# Patient Record
Sex: Male | Born: 2000 | Race: Black or African American | Hispanic: No | Marital: Single | Smoking: Never smoker
Health system: Southern US, Community
[De-identification: ages and names within clinical notes are randomized; demographics above are authoritative.]

## PROBLEM LIST (undated history)

## (undated) DIAGNOSIS — F419 Anxiety disorder, unspecified: Secondary | ICD-10-CM

## (undated) DIAGNOSIS — F32A Depression, unspecified: Secondary | ICD-10-CM

## (undated) DIAGNOSIS — F329 Major depressive disorder, single episode, unspecified: Secondary | ICD-10-CM

## (undated) HISTORY — DX: Major depressive disorder, single episode, unspecified: F32.9

## (undated) HISTORY — DX: Depression, unspecified: F32.A

---
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
## 2019-09-16 ENCOUNTER — Encounter: Payer: Self-pay | Admitting: Family Medicine

## 2019-09-17 ENCOUNTER — Encounter: Payer: Self-pay | Admitting: Family Medicine

## 2019-09-29 ENCOUNTER — Ambulatory Visit: Payer: Medicaid Other | Attending: Internal Medicine

## 2019-09-29 DIAGNOSIS — Z20822 Contact with and (suspected) exposure to covid-19: Secondary | ICD-10-CM

## 2019-09-30 LAB — NOVEL CORONAVIRUS, NAA: SARS-CoV-2, NAA: NOT DETECTED

## 2022-02-22 ENCOUNTER — Encounter (HOSPITAL_COMMUNITY): Payer: Self-pay

## 2022-02-22 ENCOUNTER — Other Ambulatory Visit: Payer: Self-pay

## 2022-02-22 ENCOUNTER — Emergency Department (HOSPITAL_COMMUNITY)
Admission: EM | Admit: 2022-02-22 | Discharge: 2022-02-23 | Disposition: A | Discharge status: Home / Self Care | Payer: Medicaid Other | Attending: Emergency Medicine | Admitting: Emergency Medicine

## 2022-02-22 DIAGNOSIS — F32A Depression, unspecified: Secondary | ICD-10-CM

## 2022-02-22 DIAGNOSIS — F411 Generalized anxiety disorder: Secondary | ICD-10-CM | POA: Diagnosis present

## 2022-02-22 DIAGNOSIS — F332 Major depressive disorder, recurrent severe without psychotic features: Secondary | ICD-10-CM | POA: Diagnosis not present

## 2022-02-22 DIAGNOSIS — Z62811 Personal history of psychological abuse in childhood: Secondary | ICD-10-CM | POA: Insufficient documentation

## 2022-02-22 DIAGNOSIS — R45851 Suicidal ideations: Secondary | ICD-10-CM | POA: Diagnosis not present

## 2022-02-22 DIAGNOSIS — T7432XA Child psychological abuse, confirmed, initial encounter: Secondary | ICD-10-CM | POA: Diagnosis present

## 2022-02-22 HISTORY — DX: Anxiety disorder, unspecified: F41.9

## 2022-02-22 LAB — CBC
HCT: 51.9 % (ref 39.0–52.0)
Hemoglobin: 16.2 g/dL (ref 13.0–17.0)
MCH: 26.9 pg (ref 26.0–34.0)
MCHC: 31.2 g/dL (ref 30.0–36.0)
MCV: 86.2 fL (ref 80.0–100.0)
Platelets: 255 10*3/uL (ref 150–400)
RBC: 6.02 MIL/uL — ABNORMAL HIGH (ref 4.22–5.81)
RDW: 14.2 % (ref 11.5–15.5)
WBC: 12.2 10*3/uL — ABNORMAL HIGH (ref 4.0–10.5)
nRBC: 0 % (ref 0.0–0.2)

## 2022-02-22 LAB — COMPREHENSIVE METABOLIC PANEL
ALT: 38 U/L (ref 0–44)
AST: 23 U/L (ref 15–41)
Albumin: 3.8 g/dL (ref 3.5–5.0)
Alkaline Phosphatase: 96 U/L (ref 38–126)
Anion gap: 8 (ref 5–15)
BUN: 19 mg/dL (ref 6–20)
CO2: 23 mmol/L (ref 22–32)
Calcium: 9.3 mg/dL (ref 8.9–10.3)
Chloride: 110 mmol/L (ref 98–111)
Creatinine, Ser: 1.04 mg/dL (ref 0.61–1.24)
GFR, Estimated: >60 mL/min (ref >60)
Glucose, Bld: 107 mg/dL — ABNORMAL HIGH (ref 70–99)
Potassium: 3.8 mmol/L (ref 3.5–5.1)
Sodium: 141 mmol/L (ref 135–145)
Total Bilirubin: 0.4 mg/dL (ref 0.3–1.2)
Total Protein: 8.2 g/dL — ABNORMAL HIGH (ref 6.5–8.1)

## 2022-02-22 LAB — SALICYLATE LEVEL: Salicylate Lvl: <7 mg/dL — ABNORMAL LOW (ref 7.0–30.0)

## 2022-02-22 LAB — ETHANOL: Alcohol, Ethyl (B): <10 mg/dL (ref <10)

## 2022-02-22 LAB — RAPID URINE DRUG SCREEN, HOSP PERFORMED
Amphetamines: NOT DETECTED
Barbiturates: NOT DETECTED
Benzodiazepines: NOT DETECTED
Cocaine: NOT DETECTED
Opiates: NOT DETECTED
Tetrahydrocannabinol: NOT DETECTED

## 2022-02-22 LAB — ACETAMINOPHEN LEVEL: Acetaminophen (Tylenol), Serum: <10 ug/mL — ABNORMAL LOW (ref 10–30)

## 2022-02-22 NOTE — ED Triage Notes (Addendum)
Pt BIB his mother stating he is suicidal. Mother states pt has had increased anxiety which is worse when in crowds or social situations. Pt states yesterday he did have suicidal thoughts. Pt denies plan or intent. Pt has been off medication x 1 month. Pt is not currently under psychological care. Per mother there is a gun in the home, but is hidden by pt's father. States knives and guns are secured in the home away from pt.

## 2022-02-22 NOTE — ED Provider Notes (Signed)
[facility] EMERGENCY DEPARTMENT Provider Note   CSN: [id number] Arrival date & time: 02/22/22  1450     History  Chief Complaint  Patient presents with   Suicidal    Thomas Donaldson is a 21 y.o. male.  HPI      Thomas Donaldson is a 21 y.o. male with past medical history that includes generalized anxiety disorder, depression, and suicidal ideation who presents to the Emergency Department accompanied by his grandmother who is also his caregiver for evaluation of depression and suicidal thoughts.  Patient states he has had increasing anxiety and depression.  This has been waxing and waning for at least 1 month.  He endorses thoughts of suicide intermittently, had suicidal thoughts yesterday but does not have specific plan.  He has had auditory hallucinations in the past but none currently.  He has been admitted to behavioral health as a teenager, but currently does not have any counselor or therapist.  He is not currently on any antidepressants.  Grandmother is at bedside and also provides history.  States that she has noticed increasing depression recently, states that he gets very anxious in crowds of people.  States that he is not very talkative at baseline and will not make eye contact.  She is concerned that he will harm himself if he does not get help.  He denies any chest pain, shortness of breath auditory or visual hallucinations at present.  Also denies any homicidal thoughts or plan    Home Medications Prior to Admission medications   Medication Sig Start Date End Date Taking? Authorizing Provider  buPROPion (WELLBUTRIN XL) 300 MG 24 hr tablet Take 1 tablet (300 mg total) by mouth daily. Use pillglide spray or place it on apple sauce, do not crush or chew. 03/18/18   Myrlene Broker, MD  naproxen (NAPROSYN) 375 MG tablet Take 1 tablet (375 mg total) by mouth 2 (two) times daily with a meal. Patient taking differently: Take 375 mg by mouth 2 (two) times daily as needed.   06/07/16   Babs Sciara, MD  ranitidine (ZANTAC) 300 MG tablet Take 1 tablet (300 mg total) by mouth at bedtime. 11/12/17   Merlyn Albert, MD      Allergies    Patient has no known allergies.    Review of Systems   Review of Systems  Constitutional:  Negative for appetite change, chills and fever.  Eyes:  Negative for visual disturbance.  Respiratory:  Negative for shortness of breath.   Cardiovascular:  Negative for chest pain.  Gastrointestinal:  Negative for abdominal pain, nausea and vomiting.  Genitourinary:  Negative for difficulty urinating.  Musculoskeletal:  Negative for back pain and neck pain.  Skin:  Negative for color change and wound.  Neurological:  Negative for dizziness, syncope, weakness, numbness and headaches.  Psychiatric/Behavioral:  Positive for hallucinations and suicidal ideas. Negative for agitation and confusion. The patient is not nervous/anxious.     Physical Exam Updated Vital Signs BP (!) 140/111 (BP Location: Right Arm)   Pulse (!) 132   Temp 99.1 F (37.3 C) (Oral)   Resp 18   Ht 5' 8.5" (1.74 m)   Wt 120.3 kg   SpO2 100%   BMI 39.74 kg/m  Physical Exam Vitals and nursing note reviewed.  Constitutional:      General: He is not in acute distress.    Appearance: Normal appearance. He is not toxic-appearing.  HENT:     Mouth/Throat:  Mouth: Mucous membranes are moist.  Cardiovascular:     Rate and Rhythm: Normal rate and regular rhythm.     Pulses: Normal pulses.  Pulmonary:     Effort: Pulmonary effort is normal.  Abdominal:     Palpations: Abdomen is soft.     Tenderness: There is no abdominal tenderness.  Musculoskeletal:        General: Normal range of motion.  Skin:    General: Skin is warm.     Capillary Refill: Capillary refill takes less than 2 seconds.  Neurological:     General: No focal deficit present.     Mental Status: He is alert.     Sensory: No sensory deficit.     Motor: No weakness.  Psychiatric:         Mood and Affect: Mood is depressed.        Speech: Speech normal.        Behavior: Behavior is withdrawn. Behavior is cooperative.        Thought Content: Thought content includes suicidal ideation. Thought content does not include homicidal or suicidal plan.     Comments: Patient does not appear to be interacting with internal stimuli.  Will not make eye contact during interview, answers questions appropriately.     ED Results / Procedures / Treatments   Labs (all labs ordered are listed, but only abnormal results are displayed) Labs Reviewed  COMPREHENSIVE METABOLIC PANEL - Abnormal; Notable for the following components:      Result Value   Glucose, Bld 107 (*)    Total Protein 8.2 (*)    All other components within normal limits  SALICYLATE LEVEL - Abnormal; Notable for the following components:   Salicylate Lvl <7.0 (*)    All other components within normal limits  ACETAMINOPHEN LEVEL - Abnormal; Notable for the following components:   Acetaminophen (Tylenol), Serum <10 (*)    All other components within normal limits  CBC - Abnormal; Notable for the following components:   WBC 12.2 (*)    RBC 6.02 (*)    All other components within normal limits  ETHANOL  RAPID URINE DRUG SCREEN, HOSP PERFORMED    EKG None  Radiology No results found.  Procedures Procedures    Medications Ordered in ED Medications - No data to display  ED Course/ Medical Decision Making/ A&P                           Medical Decision Making Patient here for anxiety, depression and suicidal thoughts.  Endorses thoughts of suicide yesterday without specific plan.  Denies suicidal thoughts at present.  Does admit to having intermittent auditory hallucinations but none currently.  Patient's grandmother at bedside and endorses concern for increased depression and concern for self-harm.  Amount and/or Complexity of Data Reviewed Labs: ordered.    Details: Labs interpreted by me, no significant  leukocytosis.  Hemoglobin unremarkable.  Chemistries reassuring.  Salicylate and Tylenol level unremarkable.  Blood alcohol less than 10.  UDS reassuring. Discussion of management or test interpretation with external provider(s): Patient has been given meal tray, he has been calm and cooperative, grandmother and mother have been at bedside intermittently.  Patient medically clear at this time.  Awaiting consultation from TTS.  Patient is currently voluntary   On recheck, patient resting comfortably.  He is remained cooperative, still awaiting TTS consult.  Discussed with overnight provider, Dr Judd Lien.  Patient may need IVC if  he becomes aggressive or threatens to leave.        Final Clinical Impression(s) / ED Diagnoses Final diagnoses:  None    Rx / DC Orders ED Discharge Orders     None         Kem Parkinson, PA-C 02/23/22 0054    Daleen Bo, MD 02/23/22 1011

## 2022-02-23 DIAGNOSIS — F32A Depression, unspecified: Secondary | ICD-10-CM | POA: Diagnosis not present

## 2022-02-23 MED ORDER — BUPROPION HCL ER (SR) 150 MG PO TB12: 150.0000 mg | ORAL_TABLET | Freq: Every day | ORAL | 0 refills | Status: AC

## 2022-02-23 MED ORDER — BUPROPION HCL ER (XL) 150 MG PO TB24
150.0000 mg | ORAL_TABLET | Freq: Every day | ORAL | Status: DC
  Administered 2022-02-23: 150 mg via ORAL
  Filled 2022-02-23: qty 1

## 2022-02-23 MED ORDER — LORAZEPAM 1 MG PO TABS
1.0000 mg | ORAL_TABLET | Freq: Once | ORAL | Status: AC
  Administered 2022-02-23: 1 mg via ORAL
  Filled 2022-02-23: qty 1

## 2022-02-23 NOTE — ED Notes (Signed)
Pt father here to pick him up and brought clothing. Pts belongings were given to grandmother.
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
## 2022-02-23 NOTE — ED Notes (Signed)
Pt placed in dictation room for TTS.
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
## 2022-02-23 NOTE — ED Notes (Signed)
Family at bedside (grandmother). Family notified of behavior health visiting hours, given copy of guidelines.

## 2022-02-23 NOTE — Discharge Instructions (Signed)
Provider has recommended this Pt to follow up with outpatient services with [facility] [place]. Patient can seek to establish care by contact the facility directly at the information provided below:   [address] [place], [place], [address] [phone]

## 2022-02-23 NOTE — ED Notes (Signed)
Pt has visitor at bedside.

## 2022-03-09 DIAGNOSIS — F329 Major depressive disorder, single episode, unspecified: Secondary | ICD-10-CM | POA: Diagnosis not present

## 2022-03-13 DIAGNOSIS — F411 Generalized anxiety disorder: Secondary | ICD-10-CM | POA: Diagnosis not present

## 2022-03-16 DIAGNOSIS — F4320 Adjustment disorder, unspecified: Secondary | ICD-10-CM | POA: Diagnosis not present

## 2022-03-23 DIAGNOSIS — F411 Generalized anxiety disorder: Secondary | ICD-10-CM | POA: Diagnosis not present

## 2022-06-08 DIAGNOSIS — F4320 Adjustment disorder, unspecified: Secondary | ICD-10-CM | POA: Diagnosis not present

## 2022-12-12 ENCOUNTER — Encounter: Payer: Self-pay | Admitting: Cardiology

## 2022-12-12 ENCOUNTER — Ambulatory Visit: Payer: Medicaid Other | Attending: Cardiology | Admitting: Cardiology

## 2022-12-12 ENCOUNTER — Other Ambulatory Visit: Payer: Self-pay | Admitting: Cardiology

## 2022-12-12 ENCOUNTER — Ambulatory Visit: Payer: Medicaid Other | Attending: Cardiology

## 2022-12-12 VITALS — BP 128/78 | HR 104 | Ht 71.0 in | Wt 290.0 lb

## 2022-12-12 DIAGNOSIS — R000 Tachycardia, unspecified: Secondary | ICD-10-CM

## 2022-12-12 NOTE — Progress Notes (Signed)
      Clinical Summary [name] is a 22 y.o.male seen today as a new consult, referred by PA Muse for the following medical problems.   1.Sinus tachycardia - no significant palpitations -10/2022 Hgb 16.8 TSH 0.30  - pcp EKG showed sinus tach 112  - no coffee. Rare caffeinated sodas. Sweat tea x cups. No energy drinks, no EtOH - walks 15 minutes daily without significant symptoms.     2.Anxiety/Depression   Past Medical History:  Diagnosis Date   Anxiety    Depression      No Known Allergies   Current Outpatient Medications  Medication Sig Dispense Refill   Vitamin D, Ergocalciferol, (DRISDOL) 1.25 MG (50000 UNIT) CAPS capsule Take 50,000 Units by mouth once a week.     buPROPion (WELLBUTRIN SR) 150 MG 12 hr tablet Take 1 tablet (150 mg total) by mouth daily. (Patient not taking: Reported on 12/12/2022) 30 tablet 0   No current facility-administered medications for this visit.     No past surgical history on file.   No Known Allergies    Family History  Problem Relation Age of Onset   Heart disease Unknown    Arthritis Unknown      Social History Mr. Ryant reports that he has never smoked. He has never used smokeless tobacco. Mr. Dani reports no history of alcohol use.   Review of Systems CONSTITUTIONAL: No weight loss, fever, chills, weakness or fatigue.  HEENT: Eyes: No visual loss, blurred vision, double vision or yellow sclerae.No hearing loss, sneezing, congestion, runny nose or sore throat.  SKIN: No rash or itching.  CARDIOVASCULAR: per hpi RESPIRATORY: No shortness of breath, cough or sputum.  GASTROINTESTINAL: No anorexia, nausea, vomiting or diarrhea. No abdominal pain or blood.  GENITOURINARY: No burning on urination, no polyuria NEUROLOGICAL: No headache, dizziness, syncope, paralysis, ataxia, numbness or tingling in the extremities. No change in bowel or bladder control.  MUSCULOSKELETAL: No muscle, back pain, joint pain or  stiffness.  LYMPHATICS: No enlarged nodes. No history of splenectomy.  PSYCHIATRIC: No history of depression or anxiety.  ENDOCRINOLOGIC: No reports of sweating, cold or heat intolerance. No polyuria or polydipsia.  Marland Kitchen   Physical Examination Today's Vitals   12/12/22 1444 12/12/22 1459 12/12/22 1514  BP: (!) 155/100 (!) 155/100 128/78  Pulse: (!) 104 (!) 104   Weight: 290 lb (131.5 kg)    Height:  (1.803 m)     Body mass index is 40.45 kg/m.  Gen: resting comfortably, no acute distress HEENT: no scleral icterus, pupils equal round and reactive, no palptable cervical adenopathy,  CV: RRR, no mrg, no jvd Resp: Clear to auscultation bilaterally GI: abdomen is soft, non-tender, non-distended, normal bowel sounds, no hepatosplenomegaly MSK: extremities are warm, no edema.  Skin: warm, no rash Neuro:  no focal deficits Psych: appropriate affect       Assessment and Plan  1.Sinus tachycardia - mild sinus tach here by EKG to 104 - reportedly heart rates have been up to 130s-140s at rest - plan for 3 day zio patch to further evaluate - recent labs with pcp, TSH was low. Further management per pcp        Antoine Poche, M.D.

## 2022-12-12 NOTE — Patient Instructions (Addendum)
Medication Instructions:  Your physician recommends that you continue on your current medications as directed. Please refer to the Current Medication list given to you today.  Labwork: none  Testing/Procedures: Your physician has recommended that you wear a Zio monitor.   This monitor is a medical device that records the heart's electrical activity. Doctors most often use these monitors to diagnose arrhythmias. Arrhythmias are problems with the speed or rhythm of the heartbeat. The monitor is a small device applied to your chest. You can wear one while you do your normal daily activities. While wearing this monitor if you have any symptoms to push the button and record what you felt. Once you have worn this monitor for the period of time provider prescribed (for 3 days), you will return the monitor device in the postage paid box. Once it is returned they will download the data collected and provide us with a report which the provider will then review and we will call you with those results. Important tips:  Avoid showering during the first 24 hours of wearing the monitor. Avoid excessive sweating to help maximize wear time. Do not submerge the device, no hot tubs, and no swimming pools. Keep any lotions or oils away from the patch. After 24 hours you may shower with the patch on. Take brief showers with your back facing the shower head.  Do not remove patch once it has been placed because that will interrupt data and decrease adhesive wear time. Push the button when you have any symptoms and write down what you were feeling. Once you have completed wearing your monitor, remove and place into box which has postage paid and place in your outgoing mailbox.  If for some reason you have misplaced your box then call our office and we can provide another box and/or mail it off for you.  Follow-Up: Your physician recommends that you schedule a follow-up appointment in: pending  Any Other Special  Instructions Will Be Listed Below (If Applicable).  If you need a refill on your cardiac medications before your next appointment, please call your pharmacy. 

## 2023-01-01 ENCOUNTER — Telehealth: Payer: Self-pay | Admitting: Cardiology

## 2023-01-01 NOTE — Telephone Encounter (Signed)
Patient's parents called to speak with nurse in regards to monitor results

## 2023-01-03 NOTE — Telephone Encounter (Signed)
Left message to return call 

## 2023-01-03 NOTE — Telephone Encounter (Signed)
Patient's mother is returning call. Requesting return call.

## 2023-01-03 NOTE — Telephone Encounter (Signed)
No abnormal heart rhythms on monitor. No worrisome findings.  No further cardiac workup is indicated, can f/u just as needed   Dominga Ferry MD

## 2023-01-03 NOTE — Telephone Encounter (Signed)
Notified. PCP copied.

## 2024-02-13 ENCOUNTER — Telehealth: Payer: Self-pay

## 2024-02-13 NOTE — Telephone Encounter (Signed)
 Received referral from [facility] at [facility] for suspected sleep apnea. Placed in sleep mailbox
